# Patient Record
Sex: Female | Born: 1950 | Race: White | Hispanic: No | Marital: Single | Smoking: Never smoker
Health system: Southern US, Community
[De-identification: ages and names within clinical notes are randomized; demographics above are authoritative.]

## PROBLEM LIST (undated history)

## (undated) DIAGNOSIS — M810 Age-related osteoporosis without current pathological fracture: Secondary | ICD-10-CM

## (undated) DIAGNOSIS — I10 Essential (primary) hypertension: Secondary | ICD-10-CM

## (undated) DIAGNOSIS — M1990 Unspecified osteoarthritis, unspecified site: Secondary | ICD-10-CM

## (undated) DIAGNOSIS — K648 Other hemorrhoids: Secondary | ICD-10-CM

## (undated) DIAGNOSIS — K5790 Diverticulosis of intestine, part unspecified, without perforation or abscess without bleeding: Secondary | ICD-10-CM

## (undated) DIAGNOSIS — E785 Hyperlipidemia, unspecified: Secondary | ICD-10-CM

## (undated) DIAGNOSIS — I639 Cerebral infarction, unspecified: Secondary | ICD-10-CM

## (undated) DIAGNOSIS — K219 Gastro-esophageal reflux disease without esophagitis: Secondary | ICD-10-CM

## (undated) HISTORY — DX: Age-related osteoporosis without current pathological fracture: M81.0

## (undated) HISTORY — DX: Other hemorrhoids: K64.8

## (undated) HISTORY — DX: Diverticulosis of intestine, part unspecified, without perforation or abscess without bleeding: K57.90

## (undated) HISTORY — DX: Hyperlipidemia, unspecified: E78.5

## (undated) HISTORY — DX: Unspecified osteoarthritis, unspecified site: M19.90

## (undated) HISTORY — DX: Gastro-esophageal reflux disease without esophagitis: K21.9

## (undated) HISTORY — DX: Cerebral infarction, unspecified: I63.9

## (undated) HISTORY — DX: Essential (primary) hypertension: I10

---

## 1990-01-07 HISTORY — PX: PANCREAS SURGERY: SHX731

## 1999-01-08 DIAGNOSIS — I639 Cerebral infarction, unspecified: Secondary | ICD-10-CM

## 1999-01-08 HISTORY — DX: Cerebral infarction, unspecified: I63.9

## 1999-05-07 ENCOUNTER — Encounter: Payer: Self-pay | Admitting: Emergency Medicine

## 1999-05-08 ENCOUNTER — Encounter: Payer: Self-pay | Admitting: Pediatrics

## 1999-05-08 ENCOUNTER — Inpatient Hospital Stay (HOSPITAL_COMMUNITY): Admission: EM | Admit: 1999-05-08 | Discharge: 1999-05-14 | Payer: Self-pay | Admitting: Emergency Medicine

## 1999-05-09 ENCOUNTER — Encounter: Payer: Self-pay | Admitting: Pediatrics

## 1999-08-26 ENCOUNTER — Ambulatory Visit (HOSPITAL_COMMUNITY): Admission: RE | Admit: 1999-08-26 | Discharge: 1999-08-26 | Payer: Self-pay | Admitting: Pediatrics

## 1999-08-26 ENCOUNTER — Encounter: Payer: Self-pay | Admitting: Pediatrics

## 2000-01-08 HISTORY — PX: COLONOSCOPY: SHX174

## 2000-05-22 ENCOUNTER — Other Ambulatory Visit: Admission: RE | Admit: 2000-05-22 | Discharge: 2000-05-22 | Payer: Self-pay | Admitting: Family Medicine

## 2000-06-11 IMAGING — XA IR ANGIO/CAROTID/CERV BI
1 series · 12 of 24 positions shown · IV contrast (omnipaque)
Comparison: none

FINDINGS
CLINICAL DATA: PATIENT WITH APHASIA AND DYSPHONIA.
CAROTID AND CEREBRAL ARTERIOGRAMS:
FOLLOWING A FULL EXPLANATION OF THE PROCEDURE ALONG WITH THE POTENTIAL ASSOCIATED COMPLICATIONS, AN
INFORMED WITNESSED CONSENT WAS OBTAINED.
THE RIGHT GROIN WAS PREPPED AND DRAPED IN THE USUAL STERILE FASHION. THEREAFTER, USING A MODIFIED
SELDINGER TECHNIQUE, TRANSFEMORAL ACCESS INTO THE RIGHT COMMON FEMORAL ARTERY WAS OBTAINED WITHOUT
DIFFICULTY. OVER A .035 INCH GUIDEWIRE, A PINNACLE SHEATH WAS INSERTED. THROUGH THIS AND ALSO OVER
A .035 INCH GUIDEWIRE, A 5 FRENCH JB-1 CATHETER WAS ADVANCED INTO THE AORTIC ARCH REGION AND
SELECTIVE CANNULIZATION ARTERIOGRAMS WERE PERFORMED OF THE RIGHT VERTEBRAL ARTERY, THE RIGHT COMMON
CAROTID ARTERY, THE LEFT COMMON CAROTID ARTERY AND THE LEFT VERTEBRAL ARTERY. THERE WERE NO ACUTE
COMPLICATIONS. THE PATIENT TOLERATED THE PROCEDURE WELL.
MEDICATIONS UTILIZED:  VERSED 1 MG IV.
CONTRAST UTILIZED:  OMNIPAQUE 300, APPROXIMATELY 130 CC.

[Series 1: run · 12 of 113 slices shown]
[im 5/113]
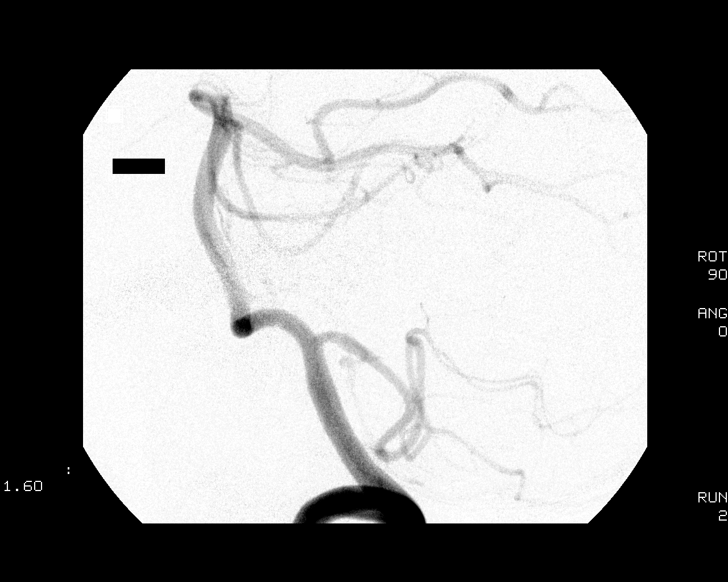
[im 15/113]
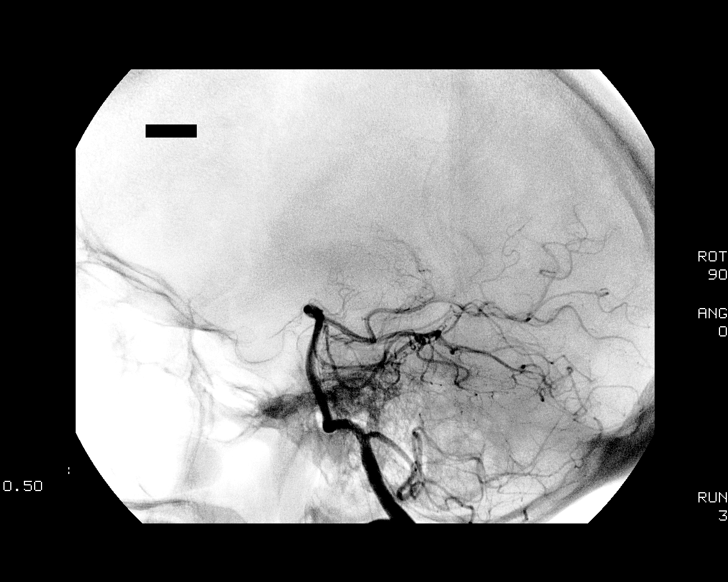
[im 25/113]
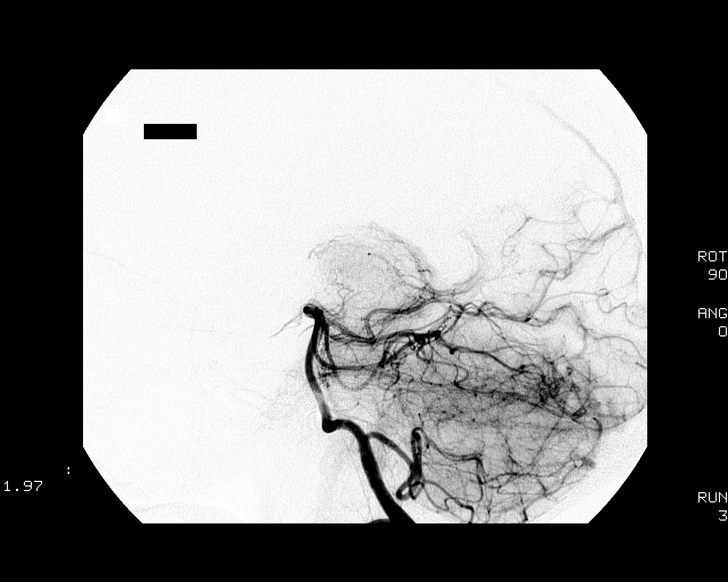
[im 35/113]
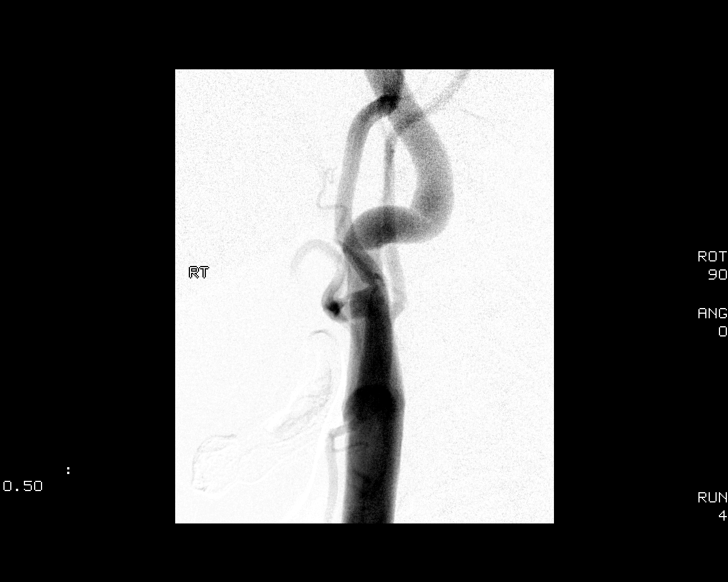
[im 44/113]
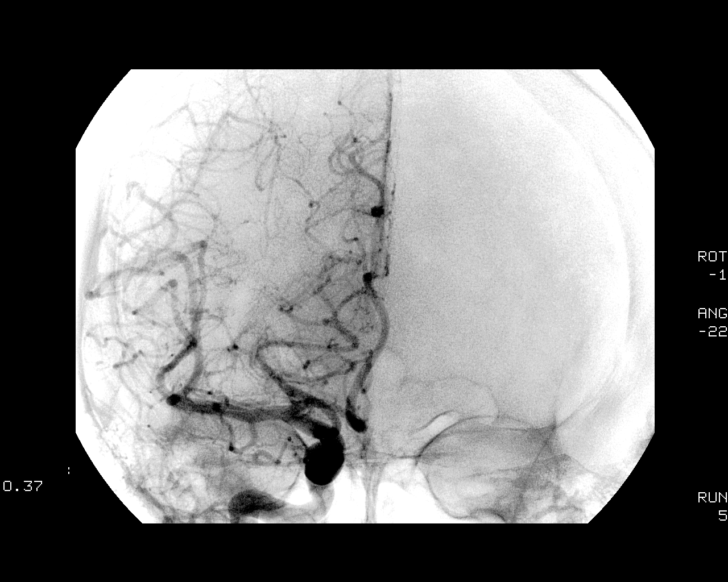
[im 54/113]
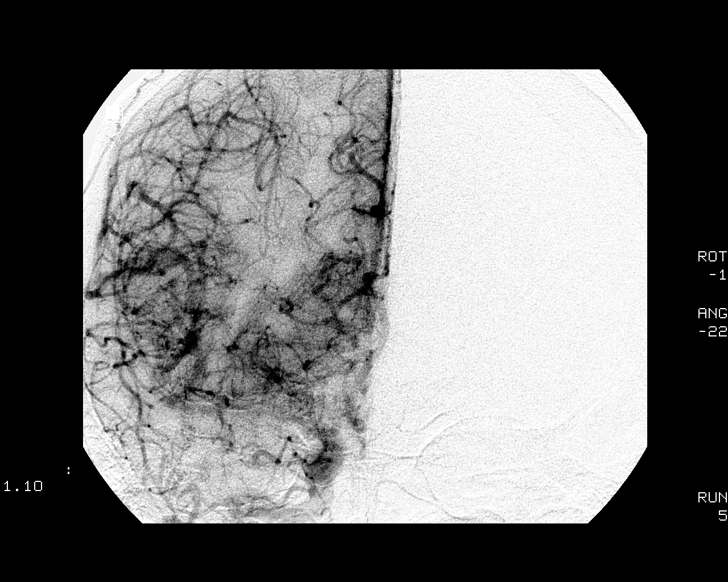
[im 64/113]
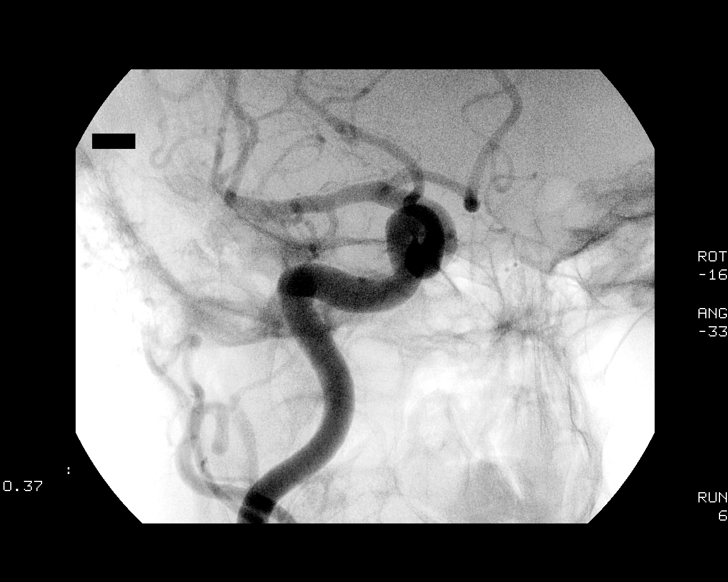
[im 74/113]
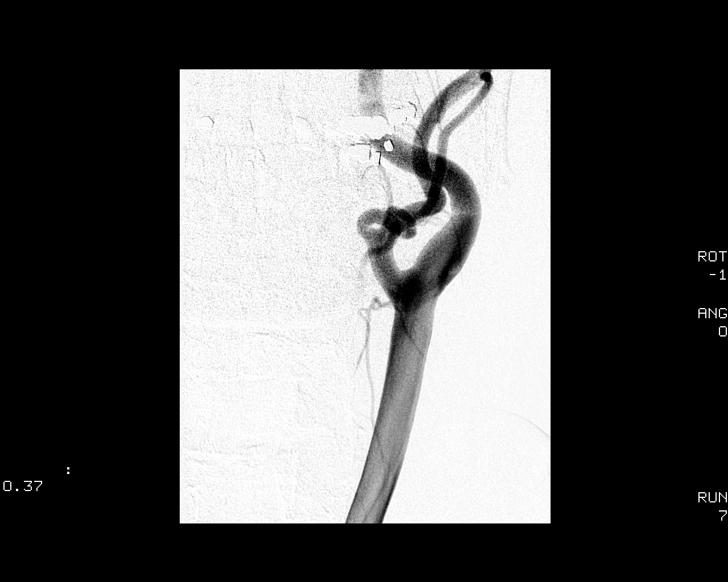
[im 83/113]
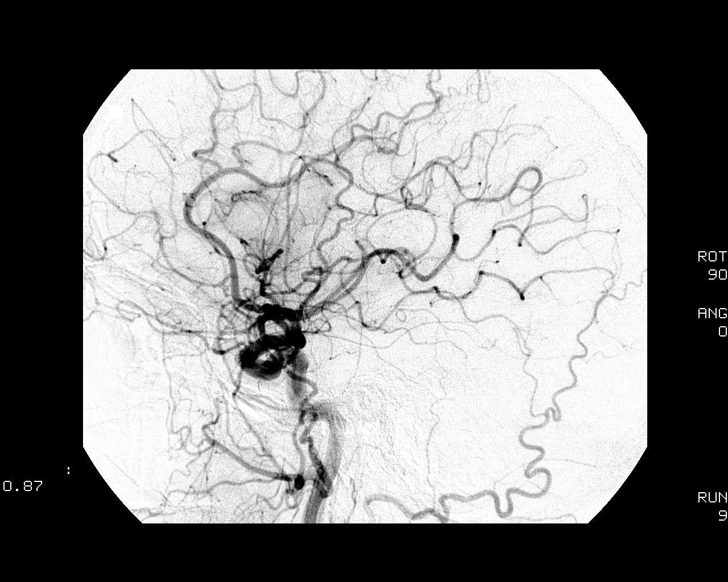
[im 93/113]
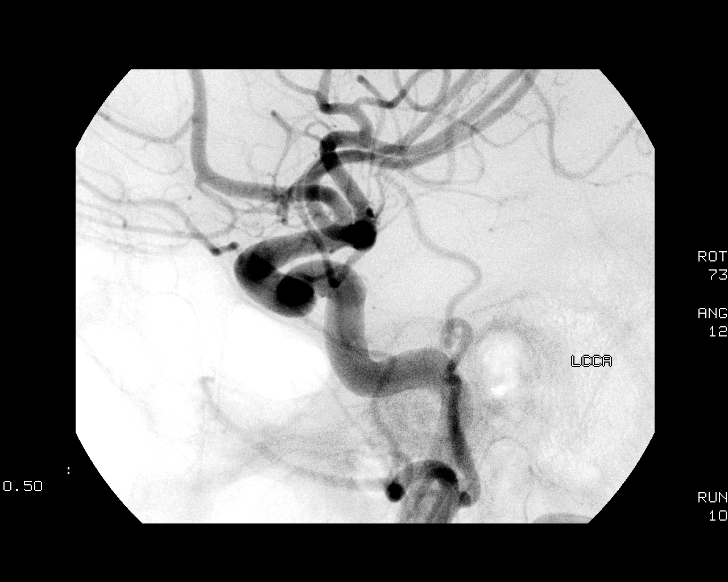
[im 103/113]
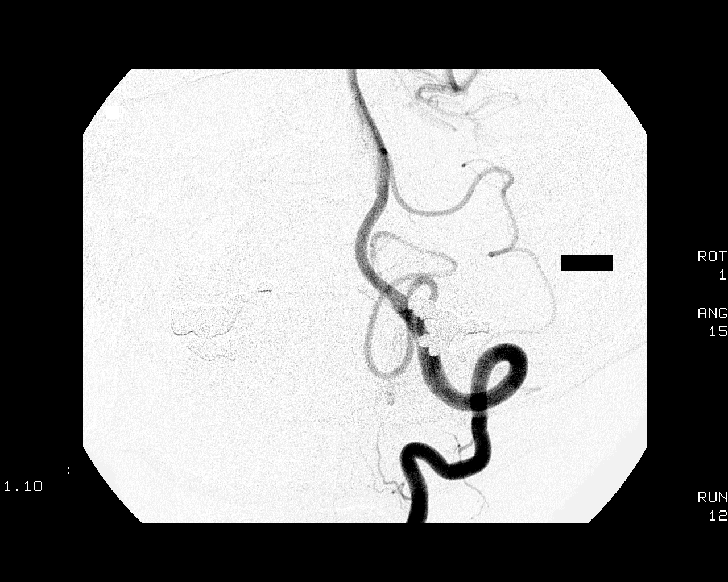
[im 113/113]
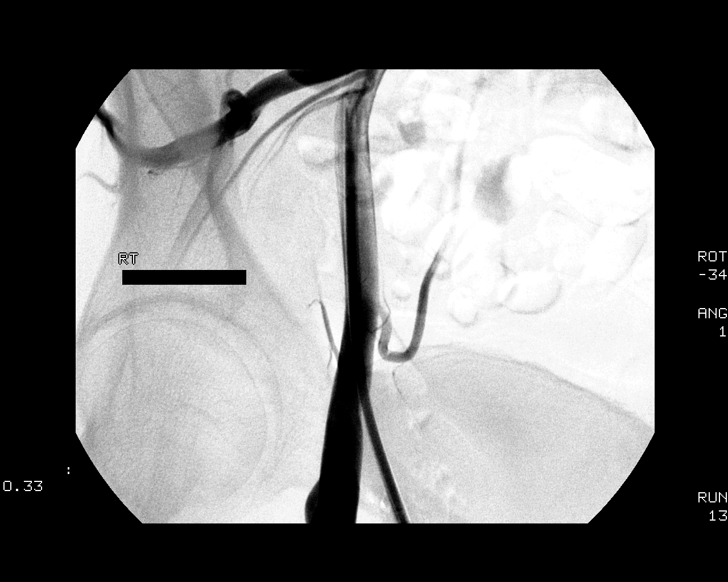

[12 of 24 positions shown; findings below may reference images not displayed]

FINDINGS: THE RIGHT VERTEBRAL ARTERY IS THE DOMINANT ONE WITH A NORMAL ORIGIN. THE VESSEL ASCENDS NORMALLY TO
THE CRANIAL SKULL BASE. THE RIGHT POSTERIOR-INFERIOR CEREBRAL ARTERY, THE BASILAR ARTERY, THE LEFT
POSTERIOR CEREBRAL ARTERY, THE SUPERIOR CEREBELLAR ARTERIES, AND THE ANTERIOR-INFERIOR CEREBRAL
ARTERIES OPACIFY NORMALLY INTO THE CAPILLARY AND VENOUS PHASES. THE RIGHT COMMON CAROTID
ARTERIOGRAM DEMONSTRATES THE CAROTID BULB TO BE UNREMARKABLE. THE RIGHT EXTERNAL CAROTID ARTERY
BRANCHES ARE NORMAL. THE RIGHT INTERNAL CAROTID ARTERY HAS MODERATE TORTUOSITY IN ITS MID CERVICAL
PORTION. MORE DISTALLY, THE VESSEL ASCENDS NORMALLY TO THE CRANIAL SKULL BASE. THERE IS A DOMINANT
RIGHT PCOM OPACIFYING THE RIGHT PCA TERRITORY. THE PETROUS, CAVERNOUS AND THE SUPRACLINOID SEGMENTS
ARE NORMAL. THE RIGHT MIDDLE AND THE RIGHT ANTERIOR CEREBRAL ARTERIES OPACIFY NORMALLY INTO THE
CAPILLARY AND VENOUS PHASES. THE LEFT COMMON CAROTID ARTERIOGRAM DEMONSTRATE THE CAROTID BULB TO BE
UNREMARKABLE. THE LEFT EXTERNAL CAROTID ARTERY ORIGIN BRANCHES ARE NORMAL. THE LEFT INTERNAL
CAROTID ARTERY ASCENDS NORMALLY TO THE CRANIAL SKULL BASE. THERE IS NORMAL OPACIFICATION OF THE
PETROUS, CAVERNOUS AND THE SUPRACLINOID SEGMENTS. THE RIGHT MIDDLE AND THE RIGHT ANTERIOR CEREBRAL
ARTERIES OPACIFY NORMALLY WITH THE CAPILLARY AND VENOUS PHASES BEING NORMAL.
THE LEFT VERTEBRAL ARTERY ORIGIN IS NORMAL. THE VESSEL ASCENDS NORMALLY TO THE CRANIAL SKULL BASE
WITH OPACIFICATION OF THE LEFT POSTERIOR-INFERIOR CEREBELLAR ARTERY WHICH IS EXTRADURAL AT THE
LEVEL OF  C1. THE BASILAR ARTERY, THE LEFT POSTERIOR CEREBRAL ARTERY, SUPERIOR CEREBELLAR ARTERIES
AND THE ANTERIOR-INFERIOR CEREBRAL ARTERIES ARE NORMALLY OPACIFIED.
IMPRESSION
1.  NO OCCLUSION, STENOSIS, FILLING DEFECTS OR VASCULAR ANOMALY IS NOTED ON THIS FOUR VESSEL
ANGIOGRAM.
2.  EXTRADURAL LOCATION OF THE LEFT POSTERIOR-INFERIOR CEREBELLAR ARTERY, A NORMAL VARIATION.

## 2000-07-09 ENCOUNTER — Ambulatory Visit (HOSPITAL_COMMUNITY): Admission: RE | Admit: 2000-07-09 | Discharge: 2000-07-09 | Payer: Self-pay | Admitting: Gastroenterology

## 2002-04-13 ENCOUNTER — Encounter: Payer: Self-pay | Admitting: Pediatrics

## 2002-04-13 ENCOUNTER — Ambulatory Visit: Admission: RE | Admit: 2002-04-13 | Discharge: 2002-04-13 | Payer: Self-pay | Admitting: Pediatrics

## 2002-08-04 ENCOUNTER — Other Ambulatory Visit: Admission: RE | Admit: 2002-08-04 | Discharge: 2002-08-04 | Payer: Self-pay | Admitting: Family Medicine

## 2003-08-05 ENCOUNTER — Other Ambulatory Visit: Admission: RE | Admit: 2003-08-05 | Discharge: 2003-08-05 | Payer: Self-pay | Admitting: Family Medicine

## 2004-08-10 ENCOUNTER — Other Ambulatory Visit: Admission: RE | Admit: 2004-08-10 | Discharge: 2004-08-10 | Payer: Self-pay | Admitting: Family Medicine

## 2004-08-10 ENCOUNTER — Ambulatory Visit: Payer: Self-pay | Admitting: Family Medicine

## 2011-01-08 HISTORY — PX: COLONOSCOPY: SHX174

## 2011-06-26 ENCOUNTER — Encounter: Payer: Self-pay | Admitting: Internal Medicine

## 2011-07-22 ENCOUNTER — Telehealth: Payer: Self-pay | Admitting: *Deleted

## 2011-07-22 ENCOUNTER — Ambulatory Visit (INDEPENDENT_AMBULATORY_CARE_PROVIDER_SITE_OTHER): Payer: BC Managed Care – PPO | Admitting: Gastroenterology

## 2011-07-22 ENCOUNTER — Encounter: Payer: Self-pay | Admitting: Gastroenterology

## 2011-07-22 VITALS — BP 114/70 | HR 60 | Ht 64.0 in | Wt 151.4 lb

## 2011-07-22 DIAGNOSIS — Z1211 Encounter for screening for malignant neoplasm of colon: Secondary | ICD-10-CM

## 2011-07-22 DIAGNOSIS — Z8673 Personal history of transient ischemic attack (TIA), and cerebral infarction without residual deficits: Secondary | ICD-10-CM | POA: Insufficient documentation

## 2011-07-22 MED ORDER — PEG-KCL-NACL-NASULF-NA ASC-C 100 G PO SOLR: 1.0000 | Freq: Once | ORAL | Status: DC

## 2011-07-22 NOTE — Telephone Encounter (Signed)
[facility] [address] [place] [address] [phone] Phone [phone] Fax   07/22/2011    RE: Cheryl Wise DOB: [birth year] MRN: [id number]   Dear Dr Dina Rich   We have scheduled the above patient for an endoscopic procedure. Our records show that she is on anticoagulation therapy.   Please advise as to how long the patient may come off her therapy of coumadin  prior to the procedure, which is scheduled for 08/15/2011  Please fax back/ or route the completed form to Ceasia Elwell at [phone].   Sincerely,  Merri Ray

## 2011-07-22 NOTE — Assessment & Plan Note (Signed)
Patient will be scheduled for screening colonoscopy. Coumadin will be held if approved by her PCP

## 2011-07-22 NOTE — Patient Instructions (Addendum)
You will be contaced by our office prior to your procedure for directions on holding your Coumadin/Warfarin.  If you do not hear from our office 1 week prior to your scheduled procedure, please call [phone] to discuss.  Colonoscopy A colonoscopy is an exam to evaluate your entire colon. In this exam, your colon is cleansed. A long fiberoptic tube is inserted through your rectum and into your colon. The fiberoptic scope (endoscope) is a long bundle of enclosed and very flexible fibers. These fibers transmit light to the area examined and send images from that area to your caregiver. Discomfort is usually minimal. You may be given a drug to help you sleep (sedative) during or prior to the procedure. This exam helps to detect lumps (tumors), polyps, inflammation, and areas of bleeding. Your caregiver may also take a small piece of tissue (biopsy) that will be examined under a microscope. LET YOUR CAREGIVER KNOW ABOUT:   Allergies to food or medicine.   Medicines taken, including vitamins, herbs, eyedrops, over-the-counter medicines, and creams.   Use of steroids (by mouth or creams).   Previous problems with anesthetics or numbing medicines.   History of bleeding problems or blood clots.   Previous surgery.   Other health problems, including diabetes and kidney problems.   Possibility of pregnancy, if this applies.  BEFORE THE PROCEDURE   A clear liquid diet may be required for 2 days before the exam.   Ask your caregiver about changing or stopping your regular medications.   Liquid injections (enemas) or laxatives may be required.   A large amount of electrolyte solution may be given to you to drink over a short period of time. This solution is used to clean out your colon.   You should be present 60 minutes prior to your procedure or as directed by your caregiver.  AFTER THE PROCEDURE   If you received a sedative or pain relieving medication, you will need to arrange for someone  to drive you home.   Occasionally, there is a little blood passed with the first bowel movement. Do not be concerned.  FINDING OUT THE RESULTS OF YOUR TEST Not all test results are available during your visit. If your test results are not back during the visit, make an appointment with your caregiver to find out the results. Do not assume everything is normal if you have not heard from your caregiver or the medical facility. It is important for you to follow up on all of your test results. HOME CARE INSTRUCTIONS   It is not unusual to pass moderate amounts of gas and experience mild abdominal cramping following the procedure. This is due to air being used to inflate your colon during the exam. Walking or a warm pack on your belly (abdomen) may help.   You may resume all normal meals and activities after sedatives and medicines have worn off.   Only take over-the-counter or prescription medicines for pain, discomfort, or fever as directed by your caregiver. Do not use aspirin or blood thinners if a biopsy was taken. Consult your caregiver for medicine usage if biopsies were taken.  SEEK IMMEDIATE MEDICAL CARE IF:   You have a fever.   You pass large blood clots or fill a toilet with blood following the procedure. This may also occur 10 to 14 days following the procedure. This is more likely if a biopsy was taken.   You develop abdominal pain that keeps getting worse and cannot be relieved with medicine.  Document Released: 12/22/1999 Document Revised: 12/13/2010 Document Reviewed: 08/06/2007 [facility] Patient Information 2012 [place], [place].

## 2011-07-22 NOTE — Progress Notes (Signed)
History of Present Illness: Pleasant 61 year old white female with history of CVA, on Coumadin, referred at the request of Dr. Sol Passer for screening colonoscopy. Last colonoscopy 2002 demonstrated hemorrhoids and left colon diverticula. She has no GI complaints including change of bowel habits, abdominal pain, melena or hematochezia.    Past Medical History  Diagnosis Date  . Diverticulosis   . Internal hemorrhoids   . HTN (hypertension)   . Stroke 2001   Past Surgical History  Procedure Date  . Colonoscopy 2002  . Pancreas surgery 1992    tumor removed   family history includes Lymphoma in her other and Stroke in her father and mother. Current Outpatient Prescriptions  Medication Sig Dispense Refill  . Calcium Carbonate-Vitamin D (CALTRATE 600+D) 600-400 MG-UNIT per tablet Take 1 tablet by mouth 2 (two) times daily.      . valsartan (DIOVAN) 160 MG tablet Take 160 mg by mouth daily.      Marland Kitchen warfarin (COUMADIN) 2 MG tablet Take 2 mg by mouth daily.       Allergies as of 07/22/2011  . (No Known Allergies)    does not have a smoking history on file. She has never used smokeless tobacco. She reports that she does not drink alcohol or use illicit drugs.     Review of Systems: Pertinent positive and negative review of systems were noted in the above HPI section. All other review of systems were otherwise negative.  Vital signs were reviewed in today's medical record Physical Exam: General: Well developed , well nourished, no acute distress Head: Normocephalic and atraumatic Eyes:  sclerae anicteric, EOMI Ears: Normal auditory acuity Mouth: No deformity or lesions Neck: Supple, no masses or thyromegaly Lungs: Clear throughout to auscultation Heart: Regular rate and rhythm; no murmurs, rubs or bruits Abdomen: Soft, non tender and non distended. No masses, hepatosplenomegaly or hernias noted. Normal Bowel sounds Rectal:deferred Musculoskeletal: Symmetrical with no gross deformities   Skin: No lesions on visible extremities Pulses:  Normal pulses noted Extremities: No clubbing, cyanosis, edema or deformities noted Neurological: Alert oriented x 4, grossly nonfocal Cervical Nodes:  No significant cervical adenopathy Inguinal Nodes: No significant inguinal adenopathy Psychological:  Alert and cooperative. Normal mood and affect

## 2011-08-01 ENCOUNTER — Telehealth: Payer: Self-pay

## 2011-08-01 NOTE — Telephone Encounter (Signed)
April called from Dr. Robyne Peers office and states pt is to hold their coumadin for 3-4 days prior to colon and she may resume it the day after the procedure. Spoke with pt and she is aware.

## 2011-08-06 NOTE — Telephone Encounter (Signed)
COMPLETE. PT AWARE TO HOLD COUMAIN SEE PHONE NOTE BY LINDA

## 2011-08-15 ENCOUNTER — Encounter: Payer: Self-pay | Admitting: Gastroenterology

## 2011-08-15 ENCOUNTER — Ambulatory Visit (AMBULATORY_SURGERY_CENTER): Payer: BC Managed Care – PPO | Admitting: Gastroenterology

## 2011-08-15 VITALS — BP 131/71 | HR 82 | Temp 98.6°F | Resp 20 | Ht 64.0 in | Wt 151.0 lb

## 2011-08-15 DIAGNOSIS — Z1211 Encounter for screening for malignant neoplasm of colon: Secondary | ICD-10-CM

## 2011-08-15 DIAGNOSIS — Z8673 Personal history of transient ischemic attack (TIA), and cerebral infarction without residual deficits: Secondary | ICD-10-CM

## 2011-08-15 MED ORDER — SODIUM CHLORIDE 0.9 % IV SOLN: 500.0000 mL | INTRAVENOUS | Status: DC

## 2011-08-15 NOTE — Op Note (Signed)
[facility] [address] [place], [place]  [address]  COLONOSCOPY PROCEDURE REPORT  PATIENT:  Cheryl Wise, Cheryl Wise  MR#:  [id number] BIRTHDATE:  November 25, 1950, 61 yrs. old  GENDER:  female ENDOSCOPIST:  Barbette Hair. Arlyce Dice, MD REF. BY:  Dario Ave, M.D. PROCEDURE DATE:  08/15/2011 PROCEDURE:  Diagnostic Colonoscopy ASA CLASS:  Class II INDICATIONS:  Routine Risk Screening MEDICATIONS:   MAC sedation, administered by CRNA propofol 260mg IV  DESCRIPTION OF PROCEDURE:   After the risks benefits and alternatives of the procedure were thoroughly explained, informed consent was obtained.  Digital rectal exam was performed and revealed no abnormalities.   The LB [id number] endoscope was introduced through the anus and advanced to the cecum, which was identified by both the appendix and ileocecal valve, without limitations.  The quality of the prep was excellent, using MoviPrep.  The instrument was then slowly withdrawn as the colon was fully examined. <<PROCEDUREIMAGES>>  FINDINGS:  A normal appearing cecum, ileocecal valve, and appendiceal orifice were identified. The ascending, hepatic flexure, transverse, splenic flexure, descending, sigmoid colon, and rectum appeared unremarkable (see image1 and image2). Retroflexed views in the rectum revealed no abnormalities.    The time to cecum =  1) 2.50  minutes. The scope was then withdrawn in 1) 8.75  minutes from the cecum and the procedure completed. COMPLICATIONS:  None ENDOSCOPIC IMPRESSION: 1) Normal colon RECOMMENDATIONS: 1) Continue current colorectal screening recommendations for "routine risk" patients with a repeat colonoscopy in 10 years. 2) resume coumadin today REPEAT EXAM:  In 10 year(s) for Colonoscopy.  ______________________________ Barbette Hair. Arlyce Dice, MD  CC:  n. eSIGNED:   Barbette Hair. Kaplan at 08/15/2011 02:56 PM  Nilda Calamity, [phone]

## 2011-08-15 NOTE — Progress Notes (Signed)
Patient did not have preoperative order for IV antibiotic SSI prophylaxis. (G8918)  Patient did not experience any of the following events: a burn prior to discharge; a fall within the facility; wrong site/side/patient/procedure/implant event; or a hospital transfer or hospital admission upon discharge from the facility. (G8907)  

## 2011-08-15 NOTE — Progress Notes (Signed)
Propofol given and oxygen managed per K Rogers CRNA 

## 2011-08-15 NOTE — Patient Instructions (Addendum)
Resume coumadin today    YOU HAD AN ENDOSCOPIC PROCEDURE TODAY AT THE [facility]: Refer to the procedure report that was given to you for any specific questions about what was found during the examination.  If the procedure report does not answer your questions, please call your gastroenterologist to clarify.  If you requested that your care partner not be given the details of your procedure findings, then the procedure report has been included in a sealed envelope for you to review at your convenience later.  YOU SHOULD EXPECT: Some feelings of bloating in the abdomen. Passage of more gas than usual.  Walking can help get rid of the air that was put into your GI tract during the procedure and reduce the bloating. If you had a lower endoscopy (such as a colonoscopy or flexible sigmoidoscopy) you may notice spotting of blood in your stool or on the toilet paper. If you underwent a bowel prep for your procedure, then you may not have a normal bowel movement for a few days.  DIET: Your first meal following the procedure should be a light meal and then it is ok to progress to your normal diet.  A half-sandwich or bowl of soup is an example of a good first meal.  Heavy or fried foods are harder to digest and may make you feel nauseous or bloated.  Likewise meals heavy in dairy and vegetables can cause extra gas to form and this can also increase the bloating.  Drink plenty of fluids but you should avoid alcoholic beverages for 24 hours.  ACTIVITY: Your care partner should take you home directly after the procedure.  You should plan to take it easy, moving slowly for the rest of the day.  You can resume normal activity the day after the procedure however you should NOT DRIVE or use heavy machinery for 24 hours (because of the sedation medicines used during the test).    SYMPTOMS TO REPORT IMMEDIATELY: A gastroenterologist can be reached at any hour.  During normal business hours, 8:30 AM to 5:00  PM Monday through Friday, call [phone].  After hours and on weekends, please call the GI answering service at [phone] who will take a message and have the physician on call contact you.   Following lower endoscopy (colonoscopy or flexible sigmoidoscopy):  Excessive amounts of blood in the stool  Significant tenderness or worsening of abdominal pains  Swelling of the abdomen that is new, acute  Fever of 100F or higher  Following upper endoscopy (EGD)  Vomiting of blood or coffee ground material  New chest pain or pain under the shoulder blades  Painful or persistently difficult swallowing  New shortness of breath  Fever of 100F or higher  Black, tarry-looking stools  FOLLOW UP: If any biopsies were taken you will be contacted by phone or by letter within the next 1-3 weeks.  Call your gastroenterologist if you have not heard about the biopsies in 3 weeks.  Our staff will call the home number listed on your records the next business day following your procedure to check on you and address any questions or concerns that you may have at that time regarding the information given to you following your procedure. This is a courtesy call and so if there is no answer at the home number and we have not heard from you through the emergency physician on call, we will assume that you have returned to your regular daily activities without incident.  SIGNATURES/CONFIDENTIALITY: You and/or your care partner have signed paperwork which will be entered into your electronic medical record.  These signatures attest to the fact that that the information above on your After Visit Summary has been reviewed and is understood.  Full responsibility of the confidentiality of this discharge information lies with you and/or your care-partner.

## 2011-08-16 ENCOUNTER — Telehealth: Payer: Self-pay | Admitting: *Deleted

## 2011-08-16 NOTE — Telephone Encounter (Signed)
  Follow up Call-  Call back number 08/15/2011  Post procedure Call Back phone  # [phone]  Permission to leave phone message Yes     Patient questions:  Do you have a fever, pain , or abdominal swelling? no Pain Score  0 *  Have you tolerated food without any problems? yes  Have you been able to return to your normal activities? yes  Do you have any questions about your discharge instructions: Diet   no Medications  no Follow up visit  no  Do you have questions or concerns about your Care? no  Actions: * If pain score is 4 or above: No action needed, pain <4.

## 2012-04-14 ENCOUNTER — Ambulatory Visit (INDEPENDENT_AMBULATORY_CARE_PROVIDER_SITE_OTHER): Payer: BC Managed Care – PPO | Admitting: Neurology

## 2012-04-14 ENCOUNTER — Encounter: Payer: Self-pay | Admitting: Neurology

## 2012-04-14 VITALS — BP 118/76 | HR 62 | Ht 64.0 in | Wt 148.0 lb

## 2012-04-14 DIAGNOSIS — Z8673 Personal history of transient ischemic attack (TIA), and cerebral infarction without residual deficits: Secondary | ICD-10-CM

## 2012-04-14 NOTE — Progress Notes (Signed)
Reason for visit: History of stroke  Cheryl Wise is a 62 y.o. female  History of present illness:  Cheryl Wise is a 62 year old right-handed white female with a history of a stroke event that occurred approximately 12 years ago. The patient indicates that she became confused at work, and eventually went to the emergency room, and the stroke was apparent by CT scan and MRI evaluation. The patient underwent an extensive workup that revealed evidence of a PFO. No other etiology of the stroke was determined. The patient has been on Coumadin therapy since that time, and she has not had any recurrence of stroke or TIA. The patient has had no new numbness or weakness of the face, arms, or legs. The patient denies any issues with balance or problems controlling the bowels or the bladder. The patient is sent back to this office for a reevaluation for ongoing use of Coumadin. The patient is unclear whether or not she received a hypercoagulable state workup 12 years ago.  Past Medical History  Diagnosis Date  . Diverticulosis   . Internal hemorrhoids   . HTN (hypertension)   . Stroke 2001    Past Surgical History  Procedure Laterality Date  . Colonoscopy  2002  . Pancreas surgery  1992    tumor removed    Family History  Problem Relation Age of Onset  . Lymphoma Other     niece  . Stroke Mother   . Stroke Father   . Colon cancer Maternal Aunt   . Colon polyps Neg Hx   . Rectal cancer Neg Hx   . Stomach cancer Neg Hx     Social history:  reports that she has never smoked. She has never used smokeless tobacco. She reports that she does not drink alcohol or use illicit drugs.  Medications:  Current Outpatient Prescriptions on File Prior to Visit  Medication Sig Dispense Refill  . Calcium Carbonate-Vitamin D (CALTRATE 600+D) 600-400 MG-UNIT per tablet Take 1 tablet by mouth 2 (two) times daily.      . valsartan (DIOVAN) 160 MG tablet Take 160 mg by mouth daily.      Marland Kitchen warfarin  (COUMADIN) 2 MG tablet Take 2 mg by mouth daily.       Current Facility-Administered Medications on File Prior to Visit  Medication Dose Route Frequency Provider Last Rate Last Dose  . 0.9 %  sodium chloride infusion  500 mL Intravenous Continuous Louis Meckel, MD        Allergies: No Known Allergies  ROS:  Out of a complete 14 system review of symptoms, the patient complains only of the following symptoms, and all other reviewed systems are negative.  Weight gain Moles Allergies  Blood pressure 118/76, pulse 62, height 5\' 4"  (1.626 m), weight 148 lb (67.132 kg).  Physical Exam  General: The patient is alert and cooperative at the time of the examination.  Head: Pupils are equal, round, and reactive to light. Discs are flat bilaterally.  Neck: The neck is supple, no carotid bruits are noted.  Respiratory: The respiratory examination is clear.  Cardiovascular: The cardiovascular examination reveals a regular rate and rhythm, no obvious murmurs or rubs are noted.  Skin: Extremities are without significant edema.  Neurologic Exam  Mental status:  Cranial nerves: Facial symmetry is present. There is good sensation of the face to pinprick and soft touch bilaterally. The strength of the facial muscles and the muscles to head turning and shoulder shrug are normal  bilaterally. Speech is well enunciated, no aphasia or dysarthria is noted. Extraocular movements are full. Visual fields are full.  Motor: The motor testing reveals 5 over 5 strength of all 4 extremities. Good symmetric motor tone is noted throughout.  Sensory: Sensory testing is intact to pinprick, soft touch, vibration sensation, and position sense on all 4 extremities. No evidence of extinction is noted.  Coordination: Cerebellar testing reveals good finger-nose-finger and heel-to-shin bilaterally.  Gait and station: Gait is normal. Tandem gait is normal. Romberg is negative. No drift is seen  Reflexes: Deep  tendon reflexes are symmetric and normal bilaterally. Toes are downgoing bilaterally.   Assessment/Plan:  1. History of stroke  The patient is doing quite well at this time. The indications for Coumadin have clearly narrowed over time. Having a PFO alone is not an indication for Coumadin at this time. The patient will be sent for further blood work to evaluate for a possible hypercoagulable state, as she was only 49 at the time of her stroke without significant risk factors otherwise. The patient will be sent for a transcranial Doppler bubble study. The patient will followup through this office if needed. If the above studies are unremarkable, the patient will be taken off of Coumadin, and aspirin will be added.  Marlan Palau MD 04/14/2012 8:26 PM  [facility] [address] [place], [place] [address]  Phone [phone] Fax [phone]

## 2012-04-19 LAB — FACTOR V INHIBITOR
APTT: 42.4 s — ABNORMAL HIGH
Factor V Activity: 96 %
PT 1:1 NP 60 Min Inc. control: 15.2 s

## 2012-04-19 LAB — CARDIOLIPIN ANTIBODIES, IGM+IGG
Anticardiolipin IgG: <9 GPL U/mL (ref 0–14)
Anticardiolipin IgM: <9 MPL U/mL (ref 0–12)

## 2012-04-19 LAB — LUPUS ANTICOAGULANT
Thrombin Time: 16.6 s (ref 0.0–20.0)
dPT Confirm Ratio: 1.04 Ratio (ref 0.00–1.20)

## 2012-04-21 ENCOUNTER — Other Ambulatory Visit: Payer: Self-pay | Admitting: Neurology

## 2012-04-21 DIAGNOSIS — I6330 Cerebral infarction due to thrombosis of unspecified cerebral artery: Secondary | ICD-10-CM

## 2012-04-27 ENCOUNTER — Telehealth: Payer: Self-pay | Admitting: Neurology

## 2012-04-27 NOTE — Telephone Encounter (Signed)
I called patient. The blood work reveals no evidence of a lupus anticoagulant antibody. We will check a transcranial Doppler bubble study. If the study does not show an extremely large PFO, the patient may be a will to come off of the Coumadin.

## 2012-05-15 ENCOUNTER — Ambulatory Visit (INDEPENDENT_AMBULATORY_CARE_PROVIDER_SITE_OTHER): Payer: BC Managed Care – PPO | Admitting: Neurology

## 2012-05-15 ENCOUNTER — Ambulatory Visit (INDEPENDENT_AMBULATORY_CARE_PROVIDER_SITE_OTHER): Payer: BC Managed Care – PPO

## 2012-05-15 VITALS — BP 141/81 | HR 52 | Temp 98.3°F

## 2012-05-15 DIAGNOSIS — Q211 Atrial septal defect: Secondary | ICD-10-CM

## 2012-05-15 DIAGNOSIS — Z0289 Encounter for other administrative examinations: Secondary | ICD-10-CM

## 2012-05-15 DIAGNOSIS — I6330 Cerebral infarction due to thrombosis of unspecified cerebral artery: Secondary | ICD-10-CM

## 2012-05-15 DIAGNOSIS — Z8673 Personal history of transient ischemic attack (TIA), and cerebral infarction without residual deficits: Secondary | ICD-10-CM

## 2012-05-15 MED ORDER — ASPIRIN EC 325 MG PO TBEC: 325.0000 mg | DELAYED_RELEASE_TABLET | Freq: Every day | ORAL | Status: AC

## 2012-05-15 NOTE — Patient Instructions (Addendum)
I discussed the results of the TCD bubble study showing a large right-to-left intracardiac shunt with the patient. The current medical literature does not support long-term anticoagulation for patients with PFO and history of stroke. Endovascular PFO closure is yet being studied in randomized clinical trials and has not yet convincingly been shown to be of benefit in stroke prevention. I recommend changing to aspirin 325 milli grams daily and stopping warfarin. She will advise to followup with Dr. Anne Hahn her neurologist as needed and her primary physician Dr. Dina Rich in [place]

## 2012-05-15 NOTE — Progress Notes (Signed)
[facility] [address] [place]. [place] [address] [phone]       OFFICE CONSULT NOTE  Ms. Cheryl Wise Date of Birth:  07-14-1950 Medical Record Number:  [id number]   Referring MD:  Dr. 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 The patient indicates that she became confused at work, and eventually went to the emergency room, and the stroke was apparent by   MRI evaluation. The patient underwent an extensive workup that revealed evidence of a PFO. No other etiology of the stroke was determined. Diagnostic 4 vessel cerebral catheter angiogram was negative. Transesophageal echocardiogram confirmed a patent foramen ovale and she was started on long-term warfarin which he has continued for the last 13 years without incident. The patient has been on Coumadin therapy since that time, and she has not had any recurrence of stroke or TIA. She had only one brief episode of memory loss the year after her initial stroke and repeat MRI scan was negative for stroke. The patient has had no new numbness or weakness of the face, arms, or legs. The patient denies any issues with balance or problems controlling the bowels or the bladder. The patient is sent back to this office for a reevaluation for ongoing use of Coumadin. The patient is unclear whether or not she received a hypercoagulable state workup 12 years ago.  ROS:   14 system review of systems is positive for allergies only. PMH:  Past Medical History  Diagnosis Date  . Diverticulosis   . Internal hemorrhoids   . HTN (hypertension)   . Stroke 2001    Social History:  History   Social History  . Marital Status: Single    Spouse Name: N/A    Number of Children: 0  . Years of Education: N/A   Occupational History  . clerical     Social History Main Topics  . Smoking status: Never Smoker   . Smokeless tobacco: Never Used  . Alcohol Use: No  . Drug Use: No  . Sexually Active: Not on file   Other Topics Concern  . Not on file   Social History Narrative  . No narrative on file    Medications:   Current Outpatient Prescriptions on File Prior to Visit  Medication Sig Dispense Refill  . Calcium Carbonate-Vitamin D (CALTRATE 600+D) 600-400 MG-UNIT per tablet Take 1 tablet by mouth 2 (two) times daily.      . cholecalciferol (VITAMIN D) 1000 UNITS tablet Take 1,000 Units by mouth daily.      . valsartan (DIOVAN) 160 MG tablet Take 160 mg by mouth daily.       Current Facility-Administered Medications on File Prior to Visit  Medication Dose Route Frequency Provider Last Rate Last Dose  . 0.9 %  sodium chloride infusion  500 mL Intravenous Continuous Louis Meckel, MD        Allergies:  No Known Allergies There were no vitals filed for this visit. Filed Vitals:   05/15/12 1428  BP: 141/81  Pulse: 52  Temp: 98.3 F (36.8 C)    Physical Exam General: well developed, well nourished, seated, in no evident distress Head: head normocephalic and atraumatic. Orohparynx benign Neck: supple with no carotid or supraclavicular bruits Cardiovascular: regular rate and rhythm, no murmurs Musculoskeletal: no deformity Skin:  no rash/petichiae Vascular:  Normal pulses all extremities  Neurologic Exam Mental Status: Awake and fully alert. Oriented to place and time. Recent and remote memory intact. Attention span, concentration and fund of knowledge appropriate. Mood and affect appropriate.  Cranial Nerves: Fundoscopic exam reveals sharp disc margins. Pupils equal, briskly reactive to light. Extraocular movements full without nystagmus. Visual fields full to confrontation. Hearing intact. Facial sensation intact. Face, tongue, palate moves normally and symmetrically.  Motor: Normal bulk and tone. Normal strength in  all tested extremity muscles. Sensory.: intact to tough and pinprick and vibratory.  Coordination: Rapid alternating movements normal in all extremities. Finger-to-nose and heel-to-shin performed accurately bilaterally. Gait and Station: Arises from chair without difficulty. Stance is normal. Gait demonstrates normal stride length and balance . Able to heel, toe and tandem walk without difficulty.  Reflexes: 1+ and symmetric. Toes downgoing.     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 No significant vascular risk factors except hypertension.    PLAN: Agree with doing Transcranial Doppler bubble study to evaluate patent foramen ovale. Recommend discontinuing warfarin and changing to aspirin 325 mg daily for secondary stroke prevention as current medical literature does not support long-term anticoagulation for patent foramen ovale and stroke based on the WARSS trial. Maintain strict control of hypertension with blood pressure goal below 130/90. Follow up with Dr. Anne Hahn as needed. No followup is necessary with me.      [facility]      [address]      [place] [address] [phone]       TRANSCRANIAL DOPPLER BUBBLE STUDY   Ms. Cheryl Wise Date of Birth:  06-26-1950 Medical Record Number:  [id number]   Indications: Diagnostic Date of Procedure:05/15/12 Clinical History:  80 year lady with remote h/o stroke and PFO Technical Description:   Transcranial Doppler Bubble Study was performed at the bedside after taking written informed consent from the patient and explaining risk/benefits. Both middle cerebral arteries were insonated using a headset. And IV line was inserted in the right forearm by the RN using aseptic precautions. Agitated saline injection at rest and after valsalva maneuver did  result in multiple high intensity transient  signals (HITS). 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   Results were explained to the patient. Questions were answered. The current medical literature does not support endovascular closure as well as anticoagulation long-term. Recommend changing to aspirin 325 mg daily.

## 2012-05-28 ENCOUNTER — Telehealth: Payer: Self-pay

## 2012-05-28 NOTE — Telephone Encounter (Signed)
Patient called clinic and left message saying she got the results on her blood work, but has not heard anything about her Bubble study.  She would like someone to call her back with the status, or with what steps she needs to take next. Call back number [phone].  Please advise.  Thank you.

## 2012-06-15 ENCOUNTER — Telehealth: Payer: Self-pay | Admitting: Neurology

## 2012-06-15 NOTE — Telephone Encounter (Addendum)
PCP's office calling (Diane) to inquire about patient's coumadin.  She's asking if Dr. Anne Hahn wants her to stay on it?  Please return the call to [phone] at your earliest convenience.

## 2012-06-17 NOTE — Telephone Encounter (Signed)
I called patient. The patient does have a large PFO but transplant Doppler bubble study. There is no evidence of a lupus antiplatelet antibody. The patient should come off of the Coumadin, and go to aspirin 325 mg daily. The patient was seen by Dr. Pearlean Brownie, and this was what was recommended.

## 2012-06-17 NOTE — Telephone Encounter (Signed)
Forwarded message to Dr. Anne Hahn.

## 2015-01-08 HISTORY — PX: MENISCUS REPAIR: SHX5179

## 2015-01-08 HISTORY — PX: APPENDECTOMY: SHX54

## 2015-07-18 ENCOUNTER — Encounter: Payer: Self-pay | Admitting: Physician Assistant

## 2015-07-27 ENCOUNTER — Ambulatory Visit: Payer: Self-pay | Admitting: Physician Assistant

## 2018-08-10 ENCOUNTER — Encounter: Payer: Self-pay | Admitting: Podiatry

## 2018-08-10 ENCOUNTER — Ambulatory Visit (INDEPENDENT_AMBULATORY_CARE_PROVIDER_SITE_OTHER): Payer: BC Managed Care – PPO

## 2018-08-10 ENCOUNTER — Other Ambulatory Visit: Payer: Self-pay

## 2018-08-10 ENCOUNTER — Other Ambulatory Visit: Payer: Self-pay | Admitting: Podiatry

## 2018-08-10 ENCOUNTER — Ambulatory Visit (INDEPENDENT_AMBULATORY_CARE_PROVIDER_SITE_OTHER): Payer: BC Managed Care – PPO | Admitting: Podiatry

## 2018-08-10 VITALS — Temp 97.8°F | Resp 16

## 2018-08-10 DIAGNOSIS — M6240 Contracture of muscle, unspecified site: Secondary | ICD-10-CM

## 2018-08-10 DIAGNOSIS — L601 Onycholysis: Secondary | ICD-10-CM | POA: Diagnosis not present

## 2018-08-10 DIAGNOSIS — M79671 Pain in right foot: Secondary | ICD-10-CM

## 2018-08-10 DIAGNOSIS — M2041 Other hammer toe(s) (acquired), right foot: Secondary | ICD-10-CM | POA: Diagnosis not present

## 2018-08-10 DIAGNOSIS — M79674 Pain in right toe(s): Secondary | ICD-10-CM | POA: Diagnosis not present

## 2018-08-10 DIAGNOSIS — M79676 Pain in unspecified toe(s): Secondary | ICD-10-CM

## 2018-08-10 DIAGNOSIS — B351 Tinea unguium: Secondary | ICD-10-CM

## 2018-08-10 NOTE — Progress Notes (Signed)
  Subjective:  Patient ID: Cheryl Wise, female    DOB: 1950/07/15,  MRN: [id number]  Chief Complaint  Patient presents with  . toe lesion    Rt 4th toe lateral side lsion x several mo (6), 3-4/10 tenderness Pt. states," I think I injured that toe and the spot began to grown." Tx: none - pain depends on shoe -pt denies redness/swelling  . Nail Problem    Lt hallux disocloration and thick x 1 yr; no pain/injury Tx: none    68 y.o. female presents with the above complaint.  States the left hallux nail is thickened and loose.  Endorses pain with shoes.  Review of Systems: Negative except as noted in the HPI. Denies N/V/F/Ch.  Past Medical History:  Diagnosis Date  . Diverticulosis   . HTN (hypertension)   . Internal hemorrhoids   . Stroke [facility]) 2001    Current Outpatient Medications:  .  aspirin EC 325 MG tablet, Take 1 tablet (325 mg total) by mouth daily., Disp: 100 tablet, Rfl: 3 .  atorvastatin (LIPITOR) 20 MG tablet, Take 1 tablet (20 mg total) by mouth daily for cholesterol, Disp: , Rfl:  .  Calcium Carbonate-Vitamin D (CALTRATE 600+D) 600-400 MG-UNIT per tablet, Take 1 tablet by mouth 2 (two) times daily., Disp: , Rfl:  .  cholecalciferol (VITAMIN D) 1000 UNITS tablet, Take 1,000 Units by mouth daily., Disp: , Rfl:  .  irbesartan (AVAPRO) 150 MG tablet, TAKE ONE TABLET BY MOUTH EVERY MORNING FOR HIGH BLOOD PRESSURE, Disp: , Rfl:   Current Facility-Administered Medications:  .  0.9 %  sodium chloride infusion, 500 mL, Intravenous, Continuous, Inda Castle, MD  Social History   Tobacco Use  Smoking Status Never Smoker  Smokeless Tobacco Never Used    No Known Allergies Objective:   Vitals:   08/10/18 1639  Resp: 16  Temp: 97.8 F (36.6 C)   There is no height or weight on file to calculate BMI. Constitutional Well developed. Well nourished.  Vascular Dorsalis pedis pulses palpable bilaterally. Posterior tibial pulses palpable bilaterally. Capillary refill  normal to all digits.  No cyanosis or clubbing noted. Pedal hair growth normal.  Neurologic Normal speech. Oriented to person, place, and time. Epicritic sensation to light touch grossly present bilaterally.  Dermatologic Nails well groomed and normal in appearance. No open wounds. Heloma molle lateral surface fourth toe right foot  Orthopedic:  Digital contractures of lesser digits right foot   Radiographs: Taken and reviewed no acute fractures or dislocations.  Marker present to the lateral surface of the fourth toe at the proximal phalangeal head Assessment:   1. Hammertoe of right foot   2. Contracture of tendon sheath   3. Pain in toe of right foot   4. Onycholysis   5. Pain due to onychomycosis of nail    Plan:  Patient was evaluated and treated and all questions answered.  Hammertoes R 4th/5th toes -Educated on etiology -Callus pared right lateral surface fourth toe -Discussed padding and use of toe spacers -Discussed surgical correction should pain persist  Left hallux onycholysis -Discussed likely traumatic etiology -Nail debrided back in length and thickness back to normal nail  No follow-ups on file.

## 2018-08-13 ENCOUNTER — Other Ambulatory Visit: Payer: Self-pay | Admitting: Podiatry

## 2018-08-13 DIAGNOSIS — M79671 Pain in right foot: Secondary | ICD-10-CM

## 2018-08-13 DIAGNOSIS — M2041 Other hammer toe(s) (acquired), right foot: Secondary | ICD-10-CM

## 2018-08-13 DIAGNOSIS — M6240 Contracture of muscle, unspecified site: Secondary | ICD-10-CM

## 2018-09-21 ENCOUNTER — Other Ambulatory Visit: Payer: Self-pay

## 2018-09-21 ENCOUNTER — Ambulatory Visit: Payer: BC Managed Care – PPO | Admitting: Podiatry

## 2018-09-21 DIAGNOSIS — M2041 Other hammer toe(s) (acquired), right foot: Secondary | ICD-10-CM | POA: Diagnosis not present

## 2018-09-21 DIAGNOSIS — L601 Onycholysis: Secondary | ICD-10-CM | POA: Diagnosis not present

## 2018-09-21 NOTE — Progress Notes (Signed)
  Subjective:  Patient ID: Cheryl Wise, female    DOB: 1950/12/20,  MRN: [id number]  Chief Complaint  Patient presents with  . Foot Pain    pt is here for af/u on foot pain, pt states that she is feeling much better with no comments or concerns    68 y.o. female presents with the above complaint.  Hx as above.  States she is using toe spacers and it is helping Review of Systems: Negative except as noted in the HPI. Denies N/V/F/Ch.  Past Medical History:  Diagnosis Date  . Diverticulosis   . HTN (hypertension)   . Internal hemorrhoids   . Stroke [facility]) 2001    Current Outpatient Medications:  .  aspirin EC 325 MG tablet, Take 1 tablet (325 mg total) by mouth daily., Disp: 100 tablet, Rfl: 3 .  atorvastatin (LIPITOR) 20 MG tablet, Take 1 tablet (20 mg total) by mouth daily for cholesterol, Disp: , Rfl:  .  calcium carbonate (OS-CAL) 600 MG TABS tablet, TAKE 1 TABLET BY MOUTH EVERY DAY FOR BONE STRENGHT, Disp: , Rfl:  .  Calcium Carbonate-Vitamin D (CALTRATE 600+D) 600-400 MG-UNIT per tablet, Take 1 tablet by mouth 2 (two) times daily., Disp: , Rfl:  .  cholecalciferol (VITAMIN D) 1000 UNITS tablet, Take 1,000 Units by mouth daily., Disp: , Rfl:  .  irbesartan (AVAPRO) 150 MG tablet, TAKE ONE TABLET BY MOUTH EVERY MORNING FOR HIGH BLOOD PRESSURE, Disp: , Rfl:   Current Facility-Administered Medications:  .  0.9 %  sodium chloride infusion, 500 mL, Intravenous, Continuous, Inda Castle, MD  Social History   Tobacco Use  Smoking Status Never Smoker  Smokeless Tobacco Never Used    No Known Allergies Objective:   There were no vitals filed for this visit. There is no height or weight on file to calculate BMI. Constitutional Well developed. Well nourished.  Vascular Dorsalis pedis pulses palpable bilaterally. Posterior tibial pulses palpable bilaterally. Capillary refill normal to all digits.  No cyanosis or clubbing noted. Pedal hair growth normal.  Neurologic Normal  speech. Oriented to person, place, and time. Epicritic sensation to light touch grossly present bilaterally.  Dermatologic Nails well groomed and normal in appearance. No open wounds. Heloma molle lateral surface fourth toe right foot  Orthopedic:  Digital contractures of lesser digits right foot   Radiographs: none Assessment:   1. Hammertoe of right foot   2. Onycholysis    Plan:  Patient was evaluated and treated and all questions answered.  Hammertoes R 4th/5th toes -Continue padding and use of toe spacers  -Defer surgical intervention Left hallux onycholysis -Improving.  Return if symptoms worsen or fail to improve.

## 2021-08-14 ENCOUNTER — Encounter: Payer: Self-pay | Admitting: Internal Medicine

## 2021-09-24 ENCOUNTER — Encounter: Payer: Self-pay | Admitting: Internal Medicine

## 2021-09-24 ENCOUNTER — Ambulatory Visit (AMBULATORY_SURGERY_CENTER): Payer: PPO

## 2021-09-24 VITALS — Ht 64.0 in | Wt 161.0 lb

## 2021-09-24 DIAGNOSIS — Z1211 Encounter for screening for malignant neoplasm of colon: Secondary | ICD-10-CM

## 2021-09-24 MED ORDER — PEG 3350-KCL-NA BICARB-NACL 420 G PO SOLR: 4000.0000 mL | Freq: Once | ORAL | 0 refills | Status: AC

## 2021-09-24 NOTE — Progress Notes (Signed)
No egg or soy allergy known to patient  No issues known to pt with past sedation with any surgeries or procedures Patient denies ever being told they had issues or difficulty with intubation  No FH of Malignant Hyperthermia Pt is not on diet pills Pt is not on home 02  Pt is not on blood thinners  Pt denies issues with constipation-"as long as I take my Metamucil"; patient advised to increase water/oral intake, activity, and fresh fruits/veggies prior to colon; No A fib or A flutter Have any cardiac testing pending--NO Pt instructed to use Single[employer].[email or web address] or GoodRx for a price reduction on prep

## 2021-10-08 ENCOUNTER — Other Ambulatory Visit: Payer: Self-pay | Admitting: Internal Medicine

## 2021-10-08 ENCOUNTER — Encounter: Payer: Self-pay | Admitting: Internal Medicine

## 2021-10-08 ENCOUNTER — Ambulatory Visit (AMBULATORY_SURGERY_CENTER): Payer: PPO | Admitting: Internal Medicine

## 2021-10-08 VITALS — BP 122/62 | HR 53 | Temp 97.7°F | Resp 11 | Ht 64.0 in | Wt 161.0 lb

## 2021-10-08 DIAGNOSIS — Z1211 Encounter for screening for malignant neoplasm of colon: Secondary | ICD-10-CM

## 2021-10-08 DIAGNOSIS — D122 Benign neoplasm of ascending colon: Secondary | ICD-10-CM

## 2021-10-08 MED ORDER — SODIUM CHLORIDE 0.9 % IV SOLN: 500.0000 mL | INTRAVENOUS | Status: DC

## 2021-10-08 NOTE — Progress Notes (Signed)
To PACU, VSS. Report to [email or web address] 

## 2021-10-08 NOTE — Op Note (Signed)
[facility] Patient Name: Cheryl Wise Procedure Date: 10/08/2021 8:38 AM MRN: [id number] Endoscopist: Jerene Bears , MD Age: 71 Referring MD:  Date of Birth: 03/17/1950 Gender: Female Account #: [id number] Procedure:                Colonoscopy Indications:              Screening for colorectal malignant neoplasm, Last                            colonoscopy 10 years ago Medicines:                Monitored Anesthesia Care Procedure:                Pre-Anesthesia Assessment:                           - Prior to the procedure, a History and Physical                            was performed, and patient medications and                            allergies were reviewed. The patient's tolerance of                            previous anesthesia was also reviewed. The risks                            and benefits of the procedure and the sedation                            options and risks were discussed with the patient.                            All questions were answered, and informed consent                            was obtained. Prior Anticoagulants: The patient has                            taken no previous anticoagulant or antiplatelet                            agents. ASA Grade Assessment: II - A patient with                            mild systemic disease. After reviewing the risks                            and benefits, the patient was deemed in                            satisfactory condition to undergo the procedure.  After obtaining informed consent, the colonoscope                            was passed under direct vision. Throughout the                            procedure, the patient's blood pressure, pulse, and                            oxygen saturations were monitored continuously. The                            PCF-HQ190L Colonoscope was introduced through the                            anus and advanced to the terminal  ileum. The                            colonoscopy was performed without difficulty. The                            patient tolerated the procedure well. The quality                            of the bowel preparation was good. The terminal                            ileum, ileocecal valve, appendiceal orifice, and                            rectum were photographed. Scope In: 8:47:20 AM Scope Out: 9:02:17 AM Scope Withdrawal Time: 0 hours 10 minutes 15 seconds  Total Procedure Duration: 0 hours 14 minutes 57 seconds  Findings:                 The digital rectal exam was normal.                           The terminal ileum appeared normal.                           A 2 mm polyp was found in the ascending colon. The                            polyp was sessile. The polyp was removed with a                            cold snare. Resection and retrieval were complete.                           Multiple small-mouthed diverticula were found in                            the sigmoid colon.  The exam was otherwise without abnormality on                            direct and retroflexion views. Complications:            No immediate complications. Estimated Blood Loss:     Estimated blood loss: none. Impression:               - The examined portion of the ileum was normal.                           - One 2 mm polyp in the ascending colon, removed                            with a cold snare. Resected and retrieved.                           - Mild diverticulosis in the sigmoid colon.                           - The examination was otherwise normal on direct                            and retroflexion views. Recommendation:           - Patient has a contact number available for                            emergencies. The signs and symptoms of potential                            delayed complications were discussed with the                            patient. Return to  normal activities tomorrow.                            Written discharge instructions were provided to the                            patient.                           - Resume previous diet.                           - Continue present medications.                           - Await pathology results.                           - Repeat colonoscopy may be recommended. The                            colonoscopy date will be determined after pathology  results from today's exam become available for                            review. Jerene Bears, MD 10/08/2021 9:05:06 AM This report has been signed electronically.

## 2021-10-08 NOTE — Patient Instructions (Signed)
Handouts on polyps and diverticulosis given to you today Return to normal activities tomorrow, continue present medication, resume previous diet  Await pathology results    YOU HAD AN ENDOSCOPIC PROCEDURE TODAY AT [facility]:   Refer to the procedure report that was given to you for any specific questions about what was found during the examination.  If the procedure report does not answer your questions, please call your gastroenterologist to clarify.  If you requested that your care partner not be given the details of your procedure findings, then the procedure report has been included in a sealed envelope for you to review at your convenience later.  YOU SHOULD EXPECT: Some feelings of bloating in the abdomen. Passage of more gas than usual.  Walking can help get rid of the air that was put into your GI tract during the procedure and reduce the bloating. If you had a lower endoscopy (such as a colonoscopy or flexible sigmoidoscopy) you may notice spotting of blood in your stool or on the toilet paper. If you underwent a bowel prep for your procedure, you may not have a normal bowel movement for a few days.  Please Note:  You might notice some irritation and congestion in your nose or some drainage.  This is from the oxygen used during your procedure.  There is no need for concern and it should clear up in a day or so.  SYMPTOMS TO REPORT IMMEDIATELY:  Following lower endoscopy (colonoscopy or flexible sigmoidoscopy):  Excessive amounts of blood in the stool  Significant tenderness or worsening of abdominal pains  Swelling of the abdomen that is new, acute  Fever of 100F or higher  For urgent or emergent issues, a gastroenterologist can be reached at any hour by calling [phone]. Do not use MyChart messaging for urgent concerns.    DIET:  We do recommend a small meal at first, but then you may proceed to your regular diet.  Drink plenty of fluids but you should avoid  alcoholic beverages for 24 hours.  ACTIVITY:  You should plan to take it easy for the rest of today and you should NOT DRIVE or use heavy machinery until tomorrow (because of the sedation medicines used during the test).    FOLLOW UP: Our staff will call the number listed on your records the next business day following your procedure.  We will call around 7:15- 8:00 am to check on you and address any questions or concerns that you may have regarding the information given to you following your procedure. If we do not reach you, we will leave a message.     If any biopsies were taken you will be contacted by phone or by letter within the next 1-3 weeks.  Please call us at [phone] if you have not heard about the biopsies in 3 weeks.    SIGNATURES/CONFIDENTIALITY: You and/or your care partner have signed paperwork which will be entered into your electronic medical record.  These signatures attest to the fact that that the information above on your After Visit Summary has been reviewed and is understood.  Full responsibility of the confidentiality of this discharge information lies with you and/or your care-partner.

## 2021-10-08 NOTE — Progress Notes (Signed)
Pt's states no medical or surgical changes since previsit or office visit. 

## 2021-10-08 NOTE — Progress Notes (Signed)
GASTROENTEROLOGY PROCEDURE H&P NOTE   Primary Care Physician: Algis Greenhouse, MD    Reason for Procedure:  Colon cancer screening  Plan:    Colonoscopy  Patient is appropriate for endoscopic procedure(s) in the ambulatory ([place]) setting.  The nature of the procedure, as well as the risks, benefits, and alternatives were carefully and thoroughly reviewed with the patient. Ample time for discussion and questions allowed. The patient understood, was satisfied, and agreed to proceed.     HPI: Cheryl Wise is a 71 y.o. female who presents for screening colonoscopy.  Medical history as below.  Tolerated the prep.  No recent chest pain or shortness of breath.  No abdominal pain today.  Past Medical History:  Diagnosis Date   Diverticulosis    GERD (gastroesophageal reflux disease)    random in nature   HTN (hypertension)    on meds   Hyperlipidemia    on meds   Internal hemorrhoids    Osteoarthritis    Osteoporosis    Stroke ([place]) 01/08/1999    Past Surgical History:  Procedure Laterality Date   APPENDECTOMY  2017   COLONOSCOPY  01/08/2000   COLONOSCOPY  2013   RK-MAC-movi(exc)-normal-10 yr recall   MENISCUS REPAIR Right 2017   PANCREAS SURGERY  01/07/1990   tumor removed    Prior to Admission medications   Medication Sig Start Date End Date Taking? Authorizing Provider  ascorbic acid (VITAMIN C) 500 MG tablet Take 500 mg by mouth daily.   Yes [provider]  aspirin EC 325 MG tablet Take 1 tablet (325 mg total) by mouth daily. 05/15/12  Yes Garvin Fila, MD  atorvastatin (LIPITOR) 20 MG tablet Take 1 tablet (20 mg total) by mouth daily for cholesterol 07/09/18  Yes [provider]  calcium carbonate (OS-CAL) 600 MG TABS tablet TAKE 1 TABLET BY MOUTH EVERY DAY FOR BONE STRENGHT   Yes [provider]  Calcium Carbonate-Vitamin D (CALTRATE 600+D) 600-400 MG-UNIT per tablet Take 1 tablet by mouth 2 (two) times daily.   Yes [provider]  CINNAMON PO Take 1,000 mg by mouth daily at 6 (six) AM.   Yes [provider]  irbesartan (AVAPRO) 150 MG tablet TAKE ONE TABLET BY MOUTH EVERY MORNING FOR HIGH BLOOD PRESSURE 07/09/18  Yes [provider]  Multiple Vitamins-Minerals (ZINC PO) Take 50 mg by mouth daily at 6 (six) AM.   Yes [provider]  Oral Electrolytes (EMERGEN-C ELECTRO MIX PO) Take 1 Package by mouth daily at 6 (six) AM.   Yes [provider]  PSYLLIUM PO Take 1 packet by mouth daily at 6 (six) AM.   Yes [provider]  VITAMIN D PO Take 10,000 Units by mouth daily.   Yes [provider]  vitamin E 180 MG (400 UNITS) capsule Take 400 Units by mouth daily.   Yes [provider]    Current Outpatient Medications  Medication Sig Dispense Refill   ascorbic acid (VITAMIN C) 500 MG tablet Take 500 mg by mouth daily.     aspirin EC 325 MG tablet Take 1 tablet (325 mg total) by mouth daily. 100 tablet 3   atorvastatin (LIPITOR) 20 MG tablet Take 1 tablet (20 mg total) by mouth daily for cholesterol     calcium carbonate (OS-CAL) 600 MG TABS tablet TAKE 1 TABLET BY MOUTH EVERY DAY FOR BONE STRENGHT     Calcium Carbonate-Vitamin D (CALTRATE 600+D) 600-400 MG-UNIT per tablet Take 1  tablet by mouth 2 (two) times daily.     CINNAMON PO Take 1,000 mg by mouth daily at 6 (six) AM.     irbesartan (AVAPRO) 150 MG tablet TAKE ONE TABLET BY MOUTH EVERY MORNING FOR HIGH BLOOD PRESSURE     Multiple Vitamins-Minerals (ZINC PO) Take 50 mg by mouth daily at 6 (six) AM.     Oral Electrolytes (EMERGEN-C ELECTRO MIX PO) Take 1 Package by mouth daily at 6 (six) AM.     PSYLLIUM PO Take 1 packet by mouth daily at 6 (six) AM.     VITAMIN D PO Take 10,000 Units by mouth daily.     vitamin E 180 MG (400 UNITS) capsule Take 400 Units by mouth daily.     Current Facility-Administered Medications  Medication Dose Route Frequency Provider Last Rate Last Admin   0.9 %  sodium  chloride infusion  500 mL Intravenous Continuous Aroush Chasse, Lajuan Lines, MD        Allergies as of 10/08/2021   (No Known Allergies)    Family History  Problem Relation Age of Onset   Stroke Mother    Stroke Father    Colon polyps Sister    Colon cancer Maternal Aunt    Lymphoma Other        niece   Rectal cancer Neg Hx    Stomach cancer Neg Hx    Esophageal cancer Neg Hx     Social History   Socioeconomic History   Marital status: Single    Spouse name: Not on file   Number of children: 0   Years of education: Not on file   Highest education level: Not on file  Occupational History   Occupation: clerical    Employer: [employer]  Tobacco Use   Smoking status: Never   Smokeless tobacco: Never  Vaping Use   Vaping Use: Never used  Substance and Sexual Activity   Alcohol use: No   Drug use: No   Sexual activity: Not on file  Other Topics Concern   Not on file  Social History Narrative   Not on file   Social Determinants of Health   Financial Resource Strain: Not on file  Food Insecurity: Not on file  Transportation Needs: Not on file  Physical Activity: Not on file  Stress: Not on file  Social Connections: Not on file  Intimate Partner Violence: Not on file    Physical Exam: Vital signs in last 24 hours: _0  132/60   Pulse 64   Temp 97.7 F (36.5 C)   Ht _1  (1.626 m)   Wt 161 lb (73 kg)   SpO2 99%   BMI 27.64 kg/m  GEN: NAD EYE: Sclerae anicteric ENT: MMM CV: Non-tachycardic Pulm: CTA b/l GI: Soft, NT/ND NEURO:  Alert & Oriented x 3   Zenovia Jarred, MD [facility]  10/08/2021 8:38 AM

## 2021-10-08 NOTE — Progress Notes (Signed)
Called to room to assist during endoscopic procedure.  Patient ID and intended procedure confirmed with present staff. Received instructions for my participation in the procedure from the performing physician.  

## 2021-10-10 ENCOUNTER — Encounter: Payer: Self-pay | Admitting: Internal Medicine

## 2023-07-14 ENCOUNTER — Ambulatory Visit (HOSPITAL_BASED_OUTPATIENT_CLINIC_OR_DEPARTMENT_OTHER)
Admission: EM | Admit: 2023-07-14 | Discharge: 2023-07-14 | Disposition: A | Discharge status: Home / Self Care | Attending: Family Medicine | Admitting: Family Medicine

## 2023-07-14 ENCOUNTER — Other Ambulatory Visit (HOSPITAL_BASED_OUTPATIENT_CLINIC_OR_DEPARTMENT_OTHER): Payer: Self-pay

## 2023-07-14 ENCOUNTER — Encounter (HOSPITAL_BASED_OUTPATIENT_CLINIC_OR_DEPARTMENT_OTHER): Payer: Self-pay | Admitting: Emergency Medicine

## 2023-07-14 DIAGNOSIS — J0100 Acute maxillary sinusitis, unspecified: Secondary | ICD-10-CM | POA: Diagnosis not present

## 2023-07-14 DIAGNOSIS — R051 Acute cough: Secondary | ICD-10-CM

## 2023-07-14 DIAGNOSIS — R509 Fever, unspecified: Secondary | ICD-10-CM

## 2023-07-14 LAB — POC COVID19/FLU A&B COMBO
Covid Antigen, POC: NEGATIVE
Influenza A Antigen, POC: NEGATIVE
Influenza B Antigen, POC: NEGATIVE

## 2023-07-14 MED ORDER — DOXYCYCLINE HYCLATE 100 MG PO CAPS
100.0000 mg | ORAL_CAPSULE | Freq: Two times a day (BID) | ORAL | 0 refills | Status: AC
  Filled 2023-07-14: qty 20, 10d supply, fill #0

## 2023-07-14 MED ORDER — FLUTICASONE PROPIONATE 50 MCG/ACT NA SUSP
1.0000 | Freq: Two times a day (BID) | NASAL | 0 refills | Status: AC | PRN
  Filled 2023-07-14: qty 16, 30d supply, fill #0

## 2023-07-14 NOTE — Discharge Instructions (Addendum)
 Acute sinusitis: Rapid flu and COVID were negative.  Exam is consistent with early sinusitis.  Doxycycline  100 mg twice daily for 10 days.  Fluticasone  nasal spray, 1 spray into each nostril once daily.  Encouraged sinus rinses once or twice daily.  Get plenty of fluids and rest.  Follow-up if symptoms do not improve, worsen or new symptoms occur.

## 2023-07-14 NOTE — ED Triage Notes (Signed)
 Pt c/o sinus pressure, throat feels like its on fire, nasal congestion started Friday. Pt did test for Covid yesterday and it was negative.

## 2023-07-14 NOTE — ED Provider Notes (Signed)
 [name]    CSN: [id number] Arrival date & time: 07/14/23  0758      History   Chief Complaint Chief Complaint  Patient presents with   Nasal Congestion   Sore Throat   Facial Pain    HPI Cheryl Wise is a 73 y.o. female.   Patient reports runny nose, cough, congestion.  Symptoms started on 07/11/2023 or earlier.  She feels lots of nasal pressure and pain.  She has sinus pressure, has had low-grade fevers and bodyaches.  She did a home COVID test on 07/13/2023 early in the day and it was negative.  She thinks she might have a sinus infection that she is feeling sickly.   Sore Throat Pertinent negatives include no chest pain, no abdominal pain and no shortness of breath.    Past Medical History:  Diagnosis Date   Diverticulosis    GERD (gastroesophageal reflux disease)    random in nature   HTN (hypertension)    on meds   Hyperlipidemia    on meds   Internal hemorrhoids    Osteoarthritis    Osteoporosis    Stroke ([facility]) 01/08/1999    Patient Active Problem List   Diagnosis Date Noted   Special screening for malignant neoplasms, colon 07/22/2011   History of CVA (cerebrovascular accident) 07/22/2011    Past Surgical History:  Procedure Laterality Date   APPENDECTOMY  2017   COLONOSCOPY  01/08/2000   COLONOSCOPY  2013   RK-MAC-movi(exc)-normal-10 yr recall   MENISCUS REPAIR Right 2017   PANCREAS SURGERY  01/07/1990   tumor removed    OB History   No obstetric history on file.      Home Medications    Prior to Admission medications   Medication Sig Start Date End Date Taking? Authorizing Provider  atorvastatin (LIPITOR) 20 MG tablet Take 1 tablet (20 mg total) by mouth daily for cholesterol 07/09/18  Yes [provider]  doxycycline  (VIBRAMYCIN ) 100 MG capsule Take 1 capsule (100 mg total) by mouth 2 (two) times daily for 10 days. 07/14/23 07/24/23 Yes Ival Domino, FNP  fluticasone  (FLONASE ) 50 MCG/ACT nasal spray Place 1 spray into  both nostrils 2 (two) times daily as needed for rhinitis. 07/14/23 08/13/23 Yes Ival Domino, FNP  irbesartan (AVAPRO) 150 MG tablet TAKE ONE TABLET BY MOUTH EVERY MORNING FOR HIGH BLOOD PRESSURE 07/09/18  Yes [provider]  ascorbic acid (VITAMIN C) 500 MG tablet Take 500 mg by mouth daily.    [provider]  aspirin  EC 325 MG tablet Take 1 tablet (325 mg total) by mouth daily. 05/15/12   Sethi, Pramod S, MD  calcium carbonate (OS-CAL) 600 MG TABS tablet TAKE 1 TABLET BY MOUTH EVERY DAY FOR BONE STRENGHT    [provider]  Calcium Carbonate-Vitamin D (CALTRATE 600+D) 600-400 MG-UNIT per tablet Take 1 tablet by mouth 2 (two) times daily.    [provider]  CINNAMON PO Take 1,000 mg by mouth daily at 6 (six) AM.    [provider]  Multiple Vitamins-Minerals (ZINC PO) Take 50 mg by mouth daily at 6 (six) AM.    [provider]  Oral Electrolytes (EMERGEN-C ELECTRO MIX PO) Take 1 Package by mouth daily at 6 (six) AM.    [provider]  PSYLLIUM PO Take 1 packet by mouth daily at 6 (six) AM.    [provider]  VITAMIN D PO Take 10,000 Units by mouth daily.    [provider]  vitamin E 180 MG (400 UNITS) capsule Take 400 Units by mouth daily.    [provider]    Family History Family History  Problem Relation Age of Onset   Stroke Mother    Stroke Father    Colon polyps Sister    Colon cancer Maternal Aunt    Lymphoma Other        niece   Rectal cancer Neg Hx    Stomach cancer Neg Hx    Esophageal cancer Neg Hx     Social History Social History   Tobacco Use   Smoking status: Never   Smokeless tobacco: Never  Vaping Use   Vaping status: Never Used  Substance Use Topics   Alcohol use: No   Drug use: No     Allergies   Patient has no known allergies.   Review of Systems Review of Systems  Constitutional:  Positive for fever. Negative for chills.  HENT:  Positive for congestion,  postnasal drip, rhinorrhea, sinus pressure and sinus pain. Negative for ear pain and sore throat.   Eyes:  Negative for pain and visual disturbance.  Respiratory:  Positive for cough. Negative for shortness of breath.   Cardiovascular:  Negative for chest pain and palpitations.  Gastrointestinal:  Negative for abdominal pain, constipation, diarrhea, nausea and vomiting.  Genitourinary:  Negative for dysuria and hematuria.  Musculoskeletal:  Positive for arthralgias. Negative for back pain.  Skin:  Negative for color change and rash.  Neurological:  Negative for seizures and syncope.  All other systems reviewed and are negative.    Physical Exam Triage Vital Signs ED Triage Vitals  Encounter Vitals Group     BP 07/14/23 0814 (!) 164/85     Girls Systolic BP Percentile --      Girls Diastolic BP Percentile --      Boys Systolic BP Percentile --      Boys Diastolic BP Percentile --      Pulse Rate 07/14/23 0814 69     Resp 07/14/23 0814 18     Temp 07/14/23 0814 98.3 F (36.8 C)     Temp Source 07/14/23 0814 Oral     SpO2 07/14/23 0814 95 %     Weight --      Height --      Head Circumference --      Peak Flow --      Pain Score 07/14/23 0813 0     Pain Loc --      Pain Education --      Exclude from Growth Chart --    No data found.  Updated Vital Signs BP (!) 164/85 (BP Location: Right Arm)   Pulse 69   Temp 98.3 F (36.8 C) (Oral)   Resp 18   SpO2 95%   Visual Acuity Right Eye Distance:   Left Eye Distance:   Bilateral Distance:    Right Eye Near:   Left Eye Near:    Bilateral Near:     Physical Exam Vitals and nursing note reviewed.  Constitutional:      General: She is not in acute distress.    Appearance: She is well-developed. She is not ill-appearing or toxic-appearing.  HENT:     Head: Normocephalic and atraumatic.     Right Ear: Hearing, tympanic membrane, ear canal and external ear normal.     Left Ear: Hearing, tympanic membrane, ear canal and  external ear normal.     Nose: Congestion and  rhinorrhea present. Rhinorrhea is clear.     Right Sinus: Maxillary sinus tenderness present. No frontal sinus tenderness.     Left Sinus: Maxillary sinus tenderness present. No frontal sinus tenderness.     Mouth/Throat:     Lips: Pink.     Mouth: Mucous membranes are moist.     Pharynx: Uvula midline. No oropharyngeal exudate or posterior oropharyngeal erythema.     Tonsils: No tonsillar exudate.  Eyes:     Conjunctiva/sclera: Conjunctivae normal.     Pupils: Pupils are equal, round, and reactive to light.  Cardiovascular:     Rate and Rhythm: Normal rate and regular rhythm.     Heart sounds: S1 normal and S2 normal. No murmur heard. Pulmonary:     Effort: Pulmonary effort is normal. No respiratory distress.     Breath sounds: Normal breath sounds. No decreased breath sounds, wheezing, rhonchi or rales.  Abdominal:     General: Bowel sounds are normal.     Palpations: Abdomen is soft.     Tenderness: There is no abdominal tenderness.  Musculoskeletal:        General: No swelling.     Cervical back: Neck supple.  Lymphadenopathy:     Head:     Right side of head: Submental and submandibular adenopathy present. No tonsillar, preauricular or posterior auricular adenopathy.     Left side of head: Submental and submandibular adenopathy present. No tonsillar, preauricular or posterior auricular adenopathy.     Cervical: Cervical adenopathy present.     Right cervical: Superficial cervical adenopathy present.     Left cervical: Superficial cervical adenopathy present.  Skin:    General: Skin is warm and dry.     Capillary Refill: Capillary refill takes less than 2 seconds.     Findings: No rash.  Neurological:     Mental Status: She is alert and oriented to person, place, and time.  Psychiatric:        Mood and Affect: Mood normal.      UC Treatments / Results  Labs (all labs ordered are listed, but only abnormal results are  displayed) Labs Reviewed  POC COVID19/FLU A&B COMBO - Normal    EKG   Radiology No results found.  Procedures Procedures (including critical care time)  Medications Ordered in UC Medications - No data to display  Initial Impression / Assessment and Plan / UC Course  I have reviewed the triage vital signs and the nursing notes.  Pertinent labs & imaging results that were available during my care of the patient were reviewed by me and considered in my medical decision making (see chart for details).  Plan of Care: Sinusitis: Doxycycline  100 mg twice daily for 10 days.  Fluticasone  nasal spray, 1 spray into each nostril once or twice daily.  Encouraged sinus rinses twice daily.  Flu and COVID were negative.  See discharge instructions for more patient instruction and education.  Follow-up if symptoms do not improve, worsen or new symptoms occur.  I reviewed the plan of care with the patient and/or the patient's guardian.  The patient and/or guardian had time to ask questions and acknowledged that the questions were answered.  I provided instruction on symptoms or reasons to return here or to go to an ER, if symptoms/condition did not improve, worsened or if new symptoms occurred.  Final Clinical Impressions(s) / UC Diagnoses   Final diagnoses:  Acute cough  Fever, unspecified  Acute non-recurrent maxillary sinusitis     Discharge  Instructions      Acute sinusitis: Rapid flu and COVID were negative.  Exam is consistent with early sinusitis.  Doxycycline  100 mg twice daily for 10 days.  Fluticasone  nasal spray, 1 spray into each nostril once daily.  Encouraged sinus rinses once or twice daily.  Get plenty of fluids and rest.  Follow-up if symptoms do not improve, worsen or new symptoms occur.     ED Prescriptions     Medication Sig Dispense Auth. Provider   doxycycline  (VIBRAMYCIN ) 100 MG capsule Take 1 capsule (100 mg total) by mouth 2 (two) times daily for 10 days. 20  capsule Ival Domino, FNP   fluticasone  (FLONASE ) 50 MCG/ACT nasal spray Place 1 spray into both nostrils 2 (two) times daily as needed for rhinitis. 17 mL Ival Domino, FNP      PDMP not reviewed this encounter.   Ival Domino, FNP 07/14/23 [phone]

## 2023-07-25 ENCOUNTER — Encounter: Payer: Self-pay | Admitting: Advanced Practice Midwife
